# Patient Record
Sex: Female | Born: 2000 | Race: White | Hispanic: No | Marital: Single | Smoking: Current some day smoker
Health system: Southern US, Community
[De-identification: ages and names within clinical notes are randomized; demographics above are authoritative.]

## PROBLEM LIST (undated history)

## (undated) DIAGNOSIS — F909 Attention-deficit hyperactivity disorder, unspecified type: Secondary | ICD-10-CM

## (undated) DIAGNOSIS — J45909 Unspecified asthma, uncomplicated: Secondary | ICD-10-CM

## (undated) DIAGNOSIS — R45851 Suicidal ideations: Secondary | ICD-10-CM

## (undated) DIAGNOSIS — F319 Bipolar disorder, unspecified: Secondary | ICD-10-CM

## (undated) DIAGNOSIS — I10 Essential (primary) hypertension: Secondary | ICD-10-CM

## (undated) HISTORY — PX: WISDOM TOOTH EXTRACTION: SHX21

---
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
## 2020-02-26 ENCOUNTER — Encounter: Payer: Self-pay | Admitting: Adult Health

## 2020-02-26 ENCOUNTER — Encounter: Payer: Self-pay | Admitting: Family Medicine

## 2020-03-04 ENCOUNTER — Encounter: Payer: Self-pay | Admitting: *Deleted

## 2021-03-09 IMAGING — CT CT HEAD W/O CM
3 series · 15 of 44 positions shown, 18 images · non-contrast
Comparison: None.

CLINICAL DATA: Altercation, possible head trauma

EXAM:
CT HEAD WITHOUT CONTRAST
TECHNIQUE: Contiguous axial images were obtained from the base of the skull
through the vertex without intravenous contrast.

[Series 2: head wo · axial · 0.42mm/px · z∈[+1185,+1295]mm · 9 of 27 slices shown, 12 images]
[im 3/27  brain]
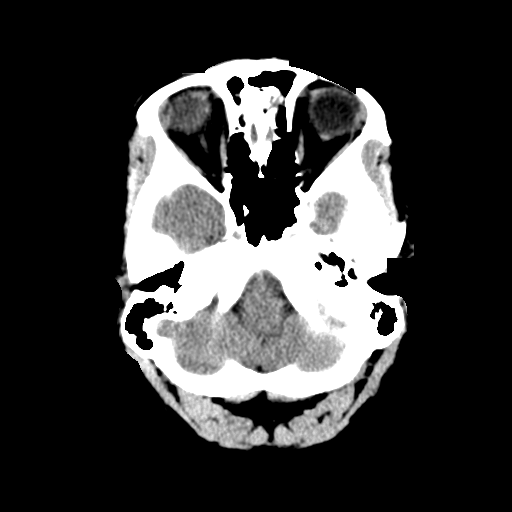
[im 3/27  bone]
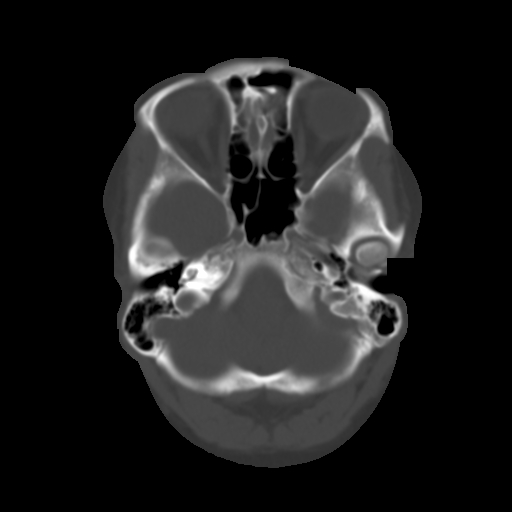
[im 6/27  brain]
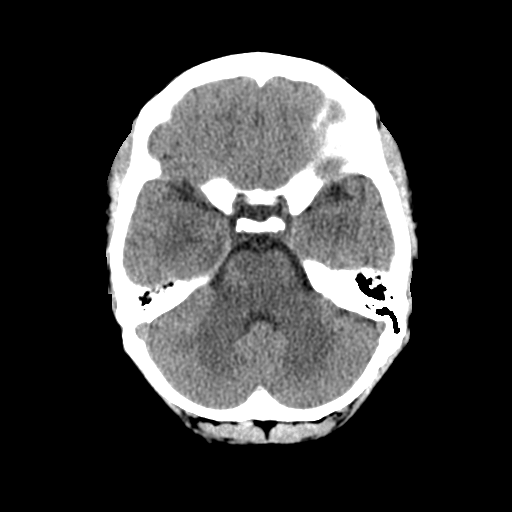
[im 8/27  brain]
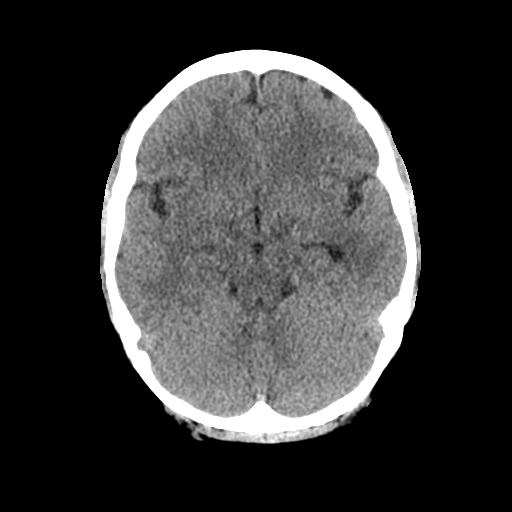
[im 11/27  brain]
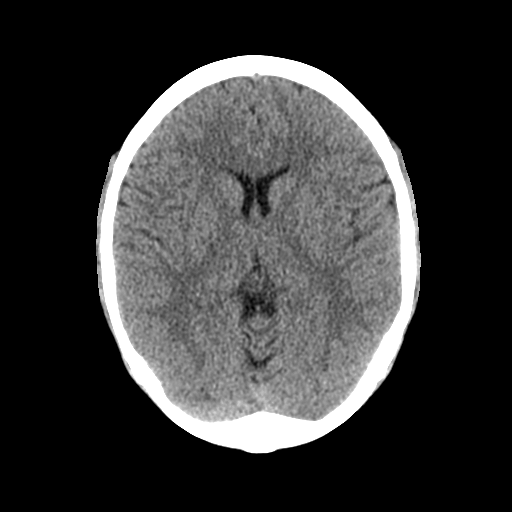
[im 14/27  brain]
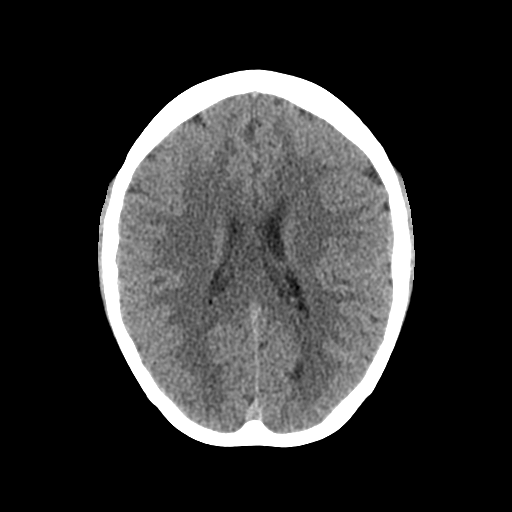
[im 14/27  bone]
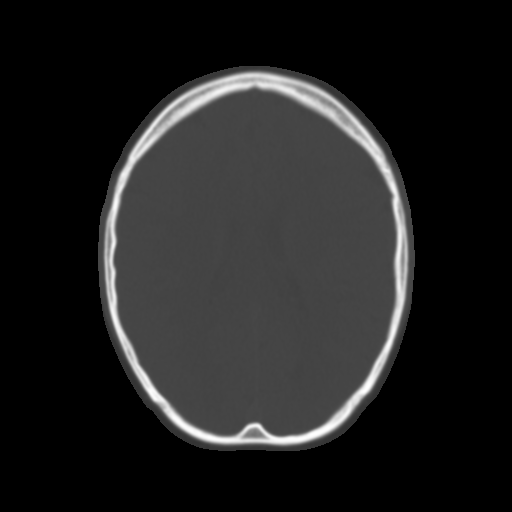
[im 17/27  brain]
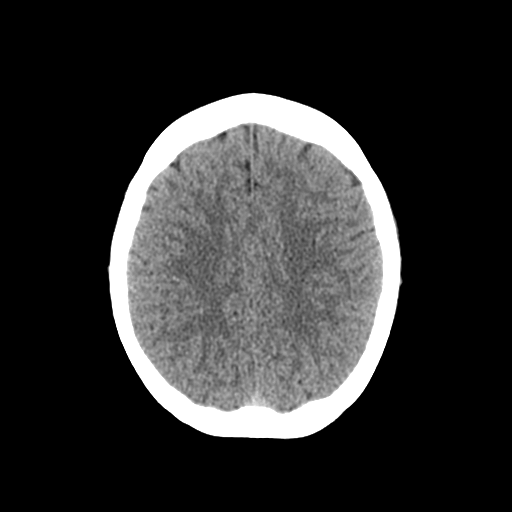
[im 20/27  brain]
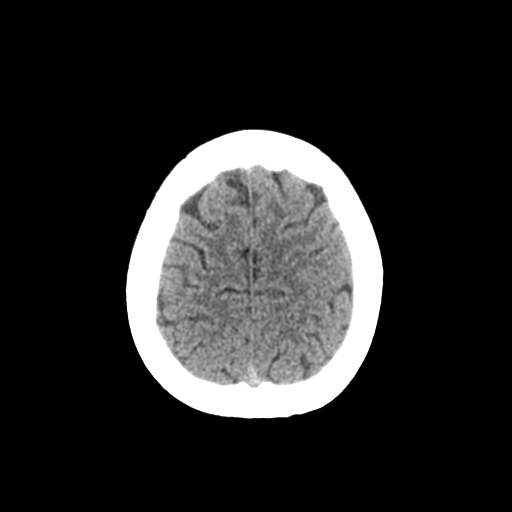
[im 22/27  brain]
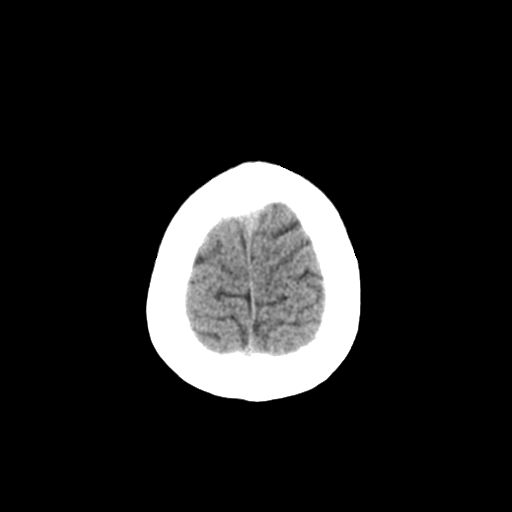
[im 25/27  brain]
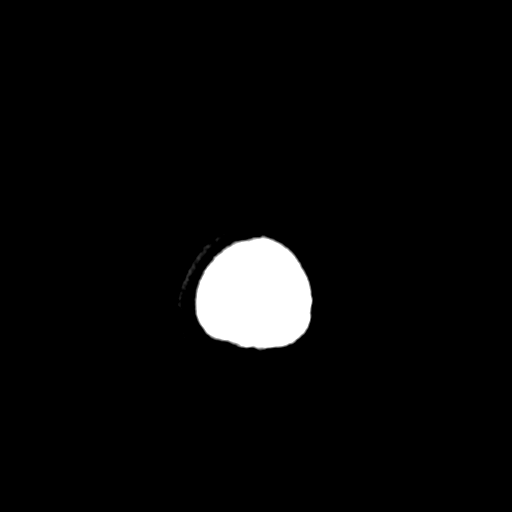
[im 25/27  bone]
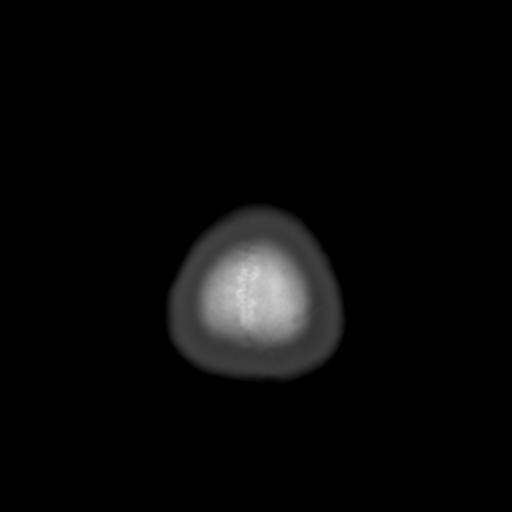

[Series 4: coronal soft tissue · coronal · 0.28mm/px · 3 of 58 slices shown]
[im 20/58  brain]
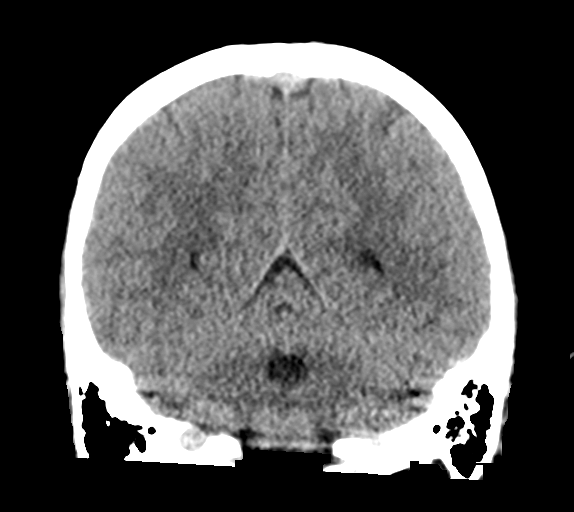
[im 26/58  brain]
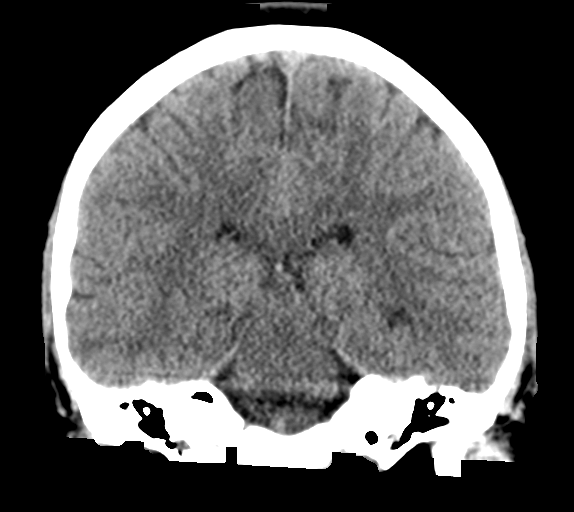
[im 32/58  brain]
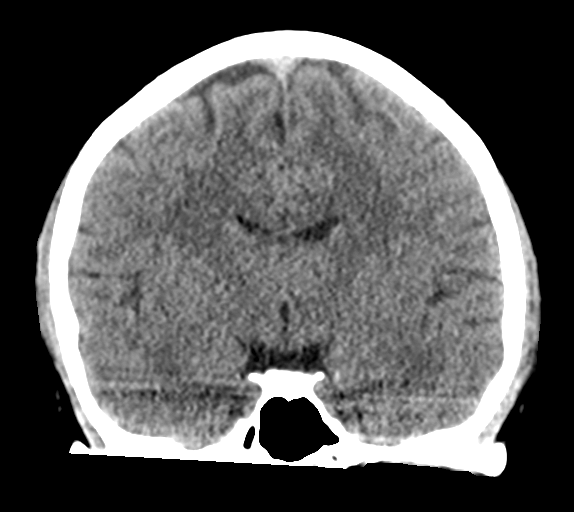

[Series 5: sagittal soft tissue · sagittal · 0.28mm/px · 3 of 50 slices shown]
[im 17/50  brain]
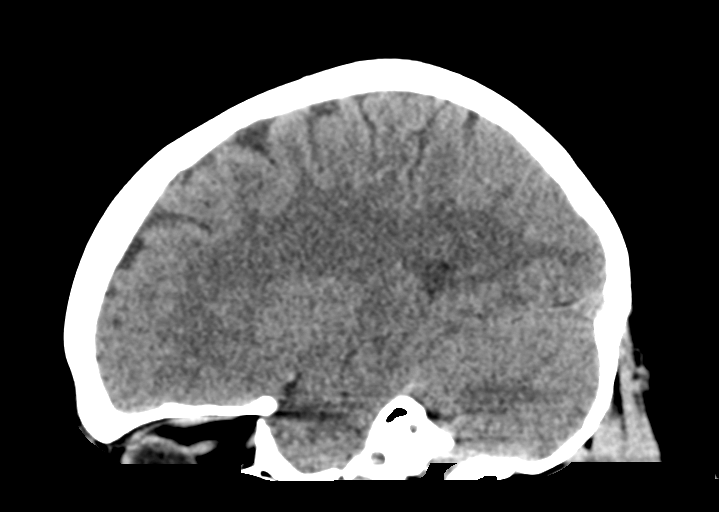
[im 25/50  brain]
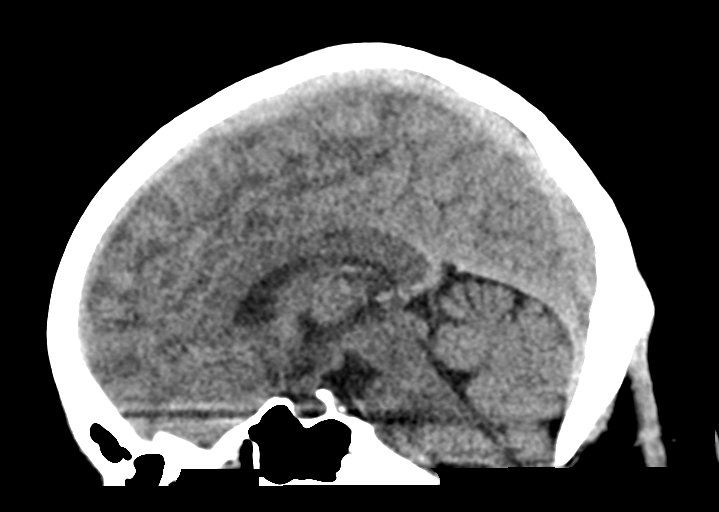
[im 33/50  brain]
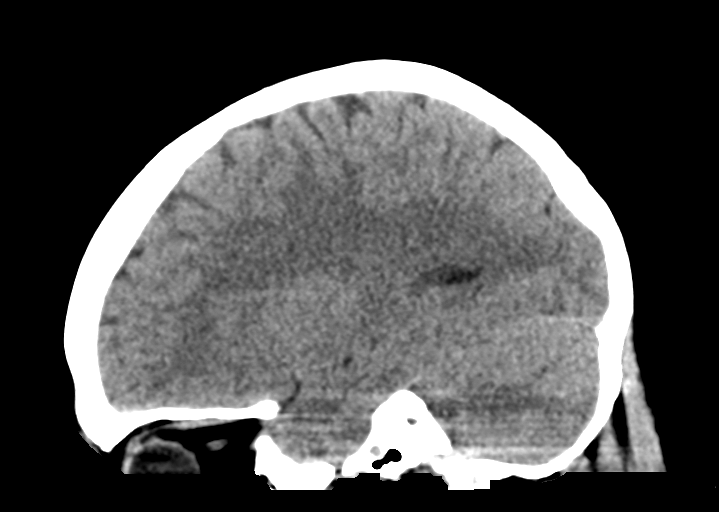

[15 of 44 positions shown; findings below may reference images not displayed]

FINDINGS: Brain: No evidence of acute infarction, hemorrhage, hydrocephalus,
extra-axial collection or mass lesion/mass effect.

Vascular: No hyperdense vessel or unexpected calcification.

Skull: No calvarial fracture or suspicious osseous lesion. No scalp
swelling or hematoma.

Sinuses/Orbits: Paranasal sinuses and mastoid air cells are
predominantly clear. Included orbital structures are unremarkable.

Other: None
IMPRESSION: No acute intracranial abnormality.

## 2024-02-29 ENCOUNTER — Emergency Department: Payer: MEDICAID

## 2024-02-29 ENCOUNTER — Emergency Department
Admission: EM | Admit: 2024-02-29 | Discharge: 2024-02-29 | Disposition: A | Payer: MEDICAID | Attending: Emergency Medicine | Admitting: Emergency Medicine

## 2024-02-29 ENCOUNTER — Other Ambulatory Visit: Payer: Self-pay

## 2024-02-29 DIAGNOSIS — J45909 Unspecified asthma, uncomplicated: Secondary | ICD-10-CM | POA: Insufficient documentation

## 2024-02-29 DIAGNOSIS — R10A2 Flank pain, left side: Secondary | ICD-10-CM | POA: Insufficient documentation

## 2024-02-29 DIAGNOSIS — I10 Essential (primary) hypertension: Secondary | ICD-10-CM | POA: Insufficient documentation

## 2024-02-29 DIAGNOSIS — R10A1 Flank pain, right side: Secondary | ICD-10-CM | POA: Diagnosis not present

## 2024-02-29 DIAGNOSIS — R102 Pelvic and perineal pain unspecified side: Secondary | ICD-10-CM | POA: Insufficient documentation

## 2024-02-29 LAB — WET PREP, GENITAL
Clue Cells Wet Prep HPF POC: NONE SEEN
Sperm: NONE SEEN
Trich, Wet Prep: NONE SEEN
WBC, Wet Prep HPF POC: >=10 — AB (ref <10)
Yeast Wet Prep HPF POC: NONE SEEN

## 2024-02-29 LAB — URINALYSIS, ROUTINE W REFLEX MICROSCOPIC
Bacteria, UA: NONE SEEN
Bilirubin Urine: NEGATIVE
Glucose, UA: NEGATIVE mg/dL
Hgb urine dipstick: NEGATIVE
Ketones, ur: NEGATIVE mg/dL
Nitrite: NEGATIVE
Protein, ur: NEGATIVE mg/dL
Specific Gravity, Urine: 1.005 (ref 1.005–1.030)
pH: 7 (ref 5.0–8.0)

## 2024-02-29 LAB — HEPATIC FUNCTION PANEL
ALT: 12 U/L (ref 0–44)
AST: 18 U/L (ref 15–41)
Albumin: 3.9 g/dL (ref 3.5–5.0)
Alkaline Phosphatase: 55 U/L (ref 38–126)
Bilirubin, Direct: <0.1 mg/dL (ref 0.0–0.2)
Total Bilirubin: 0.3 mg/dL (ref 0.0–1.2)
Total Protein: 7.5 g/dL (ref 6.5–8.1)

## 2024-02-29 LAB — BASIC METABOLIC PANEL WITH GFR
Anion gap: 12 (ref 5–15)
BUN: 9 mg/dL (ref 6–20)
CO2: 22 mmol/L (ref 22–32)
Calcium: 9.2 mg/dL (ref 8.9–10.3)
Chloride: 107 mmol/L (ref 98–111)
Creatinine, Ser: 0.53 mg/dL (ref 0.44–1.00)
GFR, Estimated: >60 mL/min (ref >60)
Glucose, Bld: 107 mg/dL — ABNORMAL HIGH (ref 70–99)
Potassium: 3.9 mmol/L (ref 3.5–5.1)
Sodium: 141 mmol/L (ref 135–145)

## 2024-02-29 LAB — CBC WITH DIFFERENTIAL/PLATELET
Abs Immature Granulocytes: 0.02 K/uL (ref 0.00–0.07)
Basophils Absolute: 0 K/uL (ref 0.0–0.1)
Basophils Relative: 0 %
Eosinophils Absolute: 0.2 K/uL (ref 0.0–0.5)
Eosinophils Relative: 2 %
HCT: 42.2 % (ref 36.0–46.0)
Hemoglobin: 14.1 g/dL (ref 12.0–15.0)
Immature Granulocytes: 0 %
Lymphocytes Relative: 40 %
Lymphs Abs: 3.8 K/uL (ref 0.7–4.0)
MCH: 28.7 pg (ref 26.0–34.0)
MCHC: 33.4 g/dL (ref 30.0–36.0)
MCV: 85.9 fL (ref 80.0–100.0)
Monocytes Absolute: 0.7 K/uL (ref 0.1–1.0)
Monocytes Relative: 7 %
Neutro Abs: 4.7 K/uL (ref 1.7–7.7)
Neutrophils Relative %: 51 %
Platelets: 247 K/uL (ref 150–400)
RBC: 4.91 MIL/uL (ref 3.87–5.11)
RDW: 11.8 % (ref 11.5–15.5)
WBC: 9.4 K/uL (ref 4.0–10.5)
nRBC: 0 % (ref 0.0–0.2)

## 2024-02-29 LAB — POC URINE PREG, ED: Preg Test, Ur: NEGATIVE

## 2024-02-29 LAB — CHLAMYDIA/NGC RT PCR (ARMC ONLY)
Chlamydia Tr: NOT DETECTED
N gonorrhoeae: NOT DETECTED

## 2024-02-29 LAB — LIPASE, BLOOD: Lipase: 33 U/L (ref 11–51)

## 2024-02-29 MED ORDER — MORPHINE SULFATE (PF) 4 MG/ML IV SOLN
4.0000 mg | Freq: Once | INTRAVENOUS | Status: AC
  Administered 2024-02-29: 4 mg via INTRAVENOUS
  Filled 2024-02-29: qty 1

## 2024-02-29 MED ORDER — LORAZEPAM 2 MG PO TABS
2.0000 mg | ORAL_TABLET | Freq: Once | ORAL | Status: AC
  Administered 2024-02-29: 2 mg via ORAL
  Filled 2024-02-29: qty 1

## 2024-02-29 MED ORDER — SODIUM CHLORIDE 0.9 % IV BOLUS
1000.0000 mL | Freq: Once | INTRAVENOUS | Status: AC
  Administered 2024-02-29: 1000 mL via INTRAVENOUS

## 2024-02-29 MED ORDER — IOHEXOL 300 MG/ML  SOLN
100.0000 mL | Freq: Once | INTRAMUSCULAR | Status: AC | PRN
Start: 2024-02-29 — End: 2024-02-29
  Administered 2024-02-29: 100 mL via INTRAVENOUS

## 2024-02-29 NOTE — Discharge Instructions (Addendum)
 Fortunately your evaluation in the emergency department did not find any conditions that account for your symptoms that would require hospitalization or surgeries at this time.    Your gonorrhea and Chlamydia testing were still in process of the laboratory at the time of discharge.  These results should be available by the time you wake up in the morning to check on MyChart.  Call your gynecologist if any of these are positive to start treatment.  Take acetaminophen  650 mg and ibuprofen 400 mg every 6 hours for pain.  Take with food. Continue taking your oral contraceptive medications as prescribed by your gynecologist and call them for a follow-up appointment for further testing and treatment as needed.  Thank you for choosing us  for your health care today!  Please see your primary doctor this week for a follow up appointment.   If you have any new, worsening, or unexpected symptoms call your doctor right away or come back to the emergency department for reevaluation.  It was my pleasure to care for you today.   Ginnie EDISON Cyrena, MD

## 2024-02-29 NOTE — ED Triage Notes (Signed)
 Patient ambulatory to triage with complaints of worsening pelvic pain since last night. Reports feeling a lot of pressure and swelling in her genitals. Was treated for PID by GYN a couple of months ago. States she has been celibate since treatment.

## 2024-02-29 NOTE — ED Provider Notes (Signed)
 [facility] Provider Note    Event Date/Time   First MD Initiated Contact with Patient 02/29/24 0006     (approximate)   History   Pelvic Pain   HPI  Holly Sullivan is a 23 y.o. female   Past medical history of bipolar, ADHD, hypertension and asthma who presents to the emergency department with several months of bilateral flank and lower quadrant pain.  She states it feels like a boulder in her lower belly.  She has been suffering from this for several months and had was seen by her gynecologist a couple of months ago for the same symptoms, and though she is not sexually active, has not experienced vaginal discharge, they did try an empiric treatment for PID which did nothing to help her symptoms.  She continues to have no vaginal discharge and has been abstinent from sex for over a year and a half.  She would like to defer a pelvic exam and defer STI testing today.  She has been making bowel movements, and passing flatus, though these activities seem to exacerbate her pain.  She denies GI bleeding.  She denies nausea or vomiting.  She denies any urinary symptoms like frequency or dysuria.   External Medical Documents Reviewed: Gynecology notes from October 2025 noting that her pelvic pain has improved since starting OCPs, described as Boulder on her pelvis, diagnosed at that time with chronic pelvic pain with a plan to add pelvic floor therapy to help with pelvic pressure.      Physical Exam   Triage Vital Signs: ED Triage Vitals  Encounter Vitals Group     BP 02/29/24 0004 (!) 141/101     Girls Systolic BP Percentile --      Girls Diastolic BP Percentile --      Boys Systolic BP Percentile --      Boys Diastolic BP Percentile --      Pulse Rate 02/29/24 0004 84     Resp 02/29/24 0004 16     Temp 02/29/24 0004 98.1 F (36.7 C)     Temp Source 02/29/24 0004 Oral     SpO2 02/29/24 0004 100 %     Weight 02/29/24 0005 145 lb (65.8 kg)     Height  02/29/24 0005 5' 2 (1.575 m)     Head Circumference --      Peak Flow --      Pain Score 02/29/24 0004 7     Pain Loc --      Pain Education --      Exclude from Growth Chart --     Most recent vital signs: Vitals:   02/29/24 0004 02/29/24 0108  BP: (!) 141/101 123/78  Pulse: 84   Resp: 16   Temp: 98.1 F (36.7 C)   SpO2: 100%     General: Awake, no distress.  CV:  Good peripheral perfusion.  Resp:  Normal effort.  Abd:  No distention. Other:  Slightly hypertensive otherwise vital signs normal and this nontoxic overall well-appearing patient.  She has mild bilateral lower quadrant tenderness to palpation with no significant CVA tenderness.  It is a nonperitoneal exam.  Upper quadrants benign.  She defers a pelvic exam.  She defers rectal exam.   ED Results / Procedures / Treatments   Labs (all labs ordered are listed, but only abnormal results are displayed) Labs Reviewed  WET PREP, GENITAL - Abnormal; Notable for the following components:      Result Value  WBC, Wet Prep HPF POC >=10 (*)    All other components within normal limits  URINALYSIS, ROUTINE W REFLEX MICROSCOPIC - Abnormal; Notable for the following components:   Color, Urine STRAW (*)    APPearance CLEAR (*)    Leukocytes,Ua SMALL (*)    All other components within normal limits  BASIC METABOLIC PANEL WITH GFR - Abnormal; Notable for the following components:   Glucose, Bld 107 (*)    All other components within normal limits  CHLAMYDIA/NGC RT PCR ([facility] ONLY)            CBC WITH DIFFERENTIAL/PLATELET  HEPATIC FUNCTION PANEL  LIPASE, BLOOD  POC URINE PREG, ED     I ordered and reviewed the above labs they are notable for cell counts electrolytes unremarkable, negative pregnancy test  RADIOLOGY I independently reviewed and interpreted CT of the abdomen pelvis to see no obvious inflammatory or obstructive changes. I also reviewed radiologist's formal read.   PROCEDURES:  Critical Care performed:  No  Procedures   MEDICATIONS ORDERED IN ED: Medications  sodium chloride  0.9 % bolus 1,000 mL (1,000 mLs Intravenous New Bag/Given 02/29/24 0049)  LORazepam  (ATIVAN ) tablet 2 mg (2 mg Oral Given 02/29/24 0107)  morphine (PF) 4 MG/ML injection 4 mg (4 mg Intravenous Given 02/29/24 0109)  iohexol (OMNIPAQUE) 300 MG/ML solution 100 mL (100 mLs Intravenous Contrast Given 02/29/24 0127)     IMPRESSION / MDM / ASSESSMENT AND PLAN / ED COURSE  I reviewed the triage vital signs and the nursing notes.                                Patient's presentation is most consistent with acute presentation with potential threat to life or bodily function.  Differential diagnosis includes, but is not limited to, chronic pelvic pain, STI, pelvic inflammatory disease, ovarian torsion, ovarian cysts, diverticulitis, appendicitis, renal colic, urinary tract infection, endometriosis   The patient is on the cardiac monitor to evaluate for evidence of arrhythmia and/or significant heart rate changes.  MDM:    Broad differential diagnosis in this young woman with quite chronic pelvic pain that is worsened over the last several days.  Will obtain CT scan for better evaluation, as well as basic labs urinalysis and pregnancy test.  She is not sexually active has no vaginal discharge and has been empirically treated for PID with no resolution of symptoms and does not want to test for STIs or do a pelvic or rectal exam today.  Feels very anxious being in the hospital, will give p.o. Ativan .  Morphine for pain while workup pending.  Fortunately workup unremarkable.  Patient stable.  I considered hospitalization for admission or observation however given benign workup as above, chronicity of symptoms, and adequate follow-up, I think she can be discharged at this time follow-up with her gynecologist and PMD.        FINAL CLINICAL IMPRESSION(S) / ED DIAGNOSES   Final diagnoses:  Pelvic pain in female      Rx / DC Orders   ED Discharge Orders     None        Note:  This document was prepared using Dragon voice recognition software and may include unintentional dictation errors.    Cyrena Mylar, MD 02/29/24 [phone]

## 2024-05-30 ENCOUNTER — Other Ambulatory Visit: Payer: Self-pay | Admitting: Nurse Practitioner

## 2024-05-30 DIAGNOSIS — N6311 Unspecified lump in the right breast, upper outer quadrant: Secondary | ICD-10-CM

## 2024-05-30 DIAGNOSIS — N644 Mastodynia: Secondary | ICD-10-CM

## 2024-06-06 ENCOUNTER — Other Ambulatory Visit: Payer: MEDICAID

## 2024-06-13 ENCOUNTER — Other Ambulatory Visit: Payer: MEDICAID
